# Patient Record
Sex: Female | Born: 1970 | Race: White | Hispanic: No | Marital: Single | Smoking: Former smoker
Health system: Southern US, Community
[De-identification: ages and names within clinical notes are randomized; demographics above are authoritative.]

## PROBLEM LIST (undated history)

## (undated) DIAGNOSIS — E079 Disorder of thyroid, unspecified: Secondary | ICD-10-CM

## (undated) HISTORY — PX: TONSILLECTOMY: SUR1361

## (undated) HISTORY — PX: COSMETIC SURGERY: SHX468

## (undated) HISTORY — PX: ABDOMINAL SURGERY: SHX537

## (undated) HISTORY — PX: CHOLECYSTECTOMY: SHX55

## (undated) HISTORY — PX: TUBAL LIGATION: SHX77

---

## 2016-07-21 ENCOUNTER — Encounter (HOSPITAL_BASED_OUTPATIENT_CLINIC_OR_DEPARTMENT_OTHER): Payer: Self-pay | Admitting: Emergency Medicine

## 2016-07-21 ENCOUNTER — Emergency Department (HOSPITAL_BASED_OUTPATIENT_CLINIC_OR_DEPARTMENT_OTHER)

## 2016-07-21 ENCOUNTER — Emergency Department (HOSPITAL_BASED_OUTPATIENT_CLINIC_OR_DEPARTMENT_OTHER)
Admission: EM | Admit: 2016-07-21 | Discharge: 2016-07-21 | Disposition: A | Discharge status: Other Acute Inpatient Hospital | Attending: Emergency Medicine | Admitting: Emergency Medicine

## 2016-07-21 DIAGNOSIS — I2699 Other pulmonary embolism without acute cor pulmonale: Secondary | ICD-10-CM

## 2016-07-21 DIAGNOSIS — Z87891 Personal history of nicotine dependence: Secondary | ICD-10-CM | POA: Insufficient documentation

## 2016-07-21 DIAGNOSIS — R079 Chest pain, unspecified: Secondary | ICD-10-CM | POA: Diagnosis present

## 2016-07-21 DIAGNOSIS — Z79899 Other long term (current) drug therapy: Secondary | ICD-10-CM | POA: Insufficient documentation

## 2016-07-21 DIAGNOSIS — R1012 Left upper quadrant pain: Secondary | ICD-10-CM | POA: Insufficient documentation

## 2016-07-21 DIAGNOSIS — Z9889 Other specified postprocedural states: Secondary | ICD-10-CM

## 2016-07-21 HISTORY — DX: Disorder of thyroid, unspecified: E07.9

## 2016-07-21 LAB — HEPARIN LEVEL (UNFRACTIONATED): Heparin Unfractionated: 0.31 IU/mL (ref 0.30–0.70)

## 2016-07-21 LAB — CBC WITH DIFFERENTIAL/PLATELET
Basophils Absolute: 0 10*3/uL (ref 0.0–0.1)
Basophils Relative: 0 %
EOS ABS: 0.3 10*3/uL (ref 0.0–0.7)
EOS PCT: 5 %
HCT: 35.5 % — ABNORMAL LOW (ref 36.0–46.0)
HEMOGLOBIN: 11.9 g/dL — AB (ref 12.0–15.0)
LYMPHS ABS: 1.9 10*3/uL (ref 0.7–4.0)
Lymphocytes Relative: 28 %
MCH: 31.8 pg (ref 26.0–34.0)
MCHC: 33.5 g/dL (ref 30.0–36.0)
MCV: 94.9 fL (ref 78.0–100.0)
MONO ABS: 0.9 10*3/uL (ref 0.1–1.0)
MONOS PCT: 13 %
Neutro Abs: 3.5 10*3/uL (ref 1.7–7.7)
Neutrophils Relative %: 54 %
PLATELETS: 308 10*3/uL (ref 150–400)
RBC: 3.74 MIL/uL — ABNORMAL LOW (ref 3.87–5.11)
RDW: 12.1 % (ref 11.5–15.5)
WBC: 6.6 10*3/uL (ref 4.0–10.5)

## 2016-07-21 LAB — URINALYSIS, ROUTINE W REFLEX MICROSCOPIC
BILIRUBIN URINE: NEGATIVE
GLUCOSE, UA: NEGATIVE mg/dL
HGB URINE DIPSTICK: NEGATIVE
Ketones, ur: NEGATIVE mg/dL
Leukocytes, UA: NEGATIVE
Nitrite: NEGATIVE
PROTEIN: NEGATIVE mg/dL
Specific Gravity, Urine: 1.005 (ref 1.005–1.030)
pH: 7.5 (ref 5.0–8.0)

## 2016-07-21 LAB — BASIC METABOLIC PANEL
ANION GAP: 7 (ref 5–15)
BUN: 8 mg/dL (ref 6–20)
CHLORIDE: 98 mmol/L — AB (ref 101–111)
CO2: 31 mmol/L (ref 22–32)
Calcium: 8.9 mg/dL (ref 8.9–10.3)
Creatinine, Ser: 0.8 mg/dL (ref 0.44–1.00)
GFR calc non Af Amer: >60 mL/min (ref >60)
GLUCOSE: 100 mg/dL — AB (ref 65–99)
POTASSIUM: 4.7 mmol/L (ref 3.5–5.1)
Sodium: 136 mmol/L (ref 135–145)

## 2016-07-21 LAB — LIPASE, BLOOD: Lipase: 22 U/L (ref 11–51)

## 2016-07-21 MED ORDER — HYDROMORPHONE HCL 1 MG/ML IJ SOLN
1.0000 mg | Freq: Once | INTRAMUSCULAR | Status: AC
  Administered 2016-07-21: 1 mg via INTRAVENOUS
  Filled 2016-07-21: qty 1

## 2016-07-21 MED ORDER — ONDANSETRON HCL 4 MG/2ML IJ SOLN
4.0000 mg | Freq: Once | INTRAMUSCULAR | Status: AC
  Administered 2016-07-21: 4 mg via INTRAVENOUS

## 2016-07-21 MED ORDER — IOPAMIDOL (ISOVUE-370) INJECTION 76%
100.0000 mL | Freq: Once | INTRAVENOUS | Status: AC | PRN
  Administered 2016-07-21: 100 mL via INTRAVENOUS

## 2016-07-21 MED ORDER — HEPARIN (PORCINE) IN NACL 100-0.45 UNIT/ML-% IJ SOLN
1200.0000 [IU]/h | INTRAMUSCULAR | Status: DC
  Administered 2016-07-21: 1200 [IU]/h via INTRAVENOUS
  Filled 2016-07-21: qty 250

## 2016-07-21 MED ORDER — ONDANSETRON HCL 4 MG/2ML IJ SOLN
INTRAMUSCULAR | Status: AC
  Administered 2016-07-21: 4 mg via INTRAVENOUS
  Filled 2016-07-21: qty 2

## 2016-07-21 MED ORDER — HEPARIN BOLUS VIA INFUSION
4500.0000 [IU] | Freq: Once | INTRAVENOUS | Status: AC
Start: 2016-07-21 — End: 2016-07-21
  Administered 2016-07-21: 4500 [IU] via INTRAVENOUS

## 2016-07-21 MED ORDER — OXYCODONE-ACETAMINOPHEN 5-325 MG PO TABS
1.0000 | ORAL_TABLET | Freq: Once | ORAL | Status: AC
  Administered 2016-07-21: 1 via ORAL
  Filled 2016-07-21: qty 1

## 2016-07-21 NOTE — ED Triage Notes (Signed)
Pt is post op day 6 breast augmentation.  Reports having difficulty taking deep breaths, also with rib cage pain. Denies chest pain. 2 drains present with no change in output. NAD at triage.

## 2016-07-21 NOTE — ED Provider Notes (Signed)
[facility]   CSN: [id number] Arrival date & time: 07/21/16  1217     History   Chief Complaint Chief Complaint  Patient presents with  . Post-op Problem  . Pleurisy    HPI Tracy Parker is a 46 y.o. female.  HPI   Patient presents with left lower chest/left upper abdominal pain that began suddenly, is sharp, constant, worse with deep inspiration, lying flat.  Has had constipation, disimpacted herself and then had a normal bowel movement.  Has associated chills, nausea with no vomiting.  Denies urinary symptoms.  Normal drainage from the abdominal drains, steadily decreasing.  Was previously doing 3,000 on the incentive spirometer and now only able to do 500.    Past Medical History:  Diagnosis Date  . Thyroid disease     There are no active problems to display for this patient.   Past Surgical History:  Procedure Laterality Date  . ABDOMINAL SURGERY    . CHOLECYSTECTOMY    . COSMETIC SURGERY    . TONSILLECTOMY    . TUBAL LIGATION      OB History    No data available       Home Medications    Prior to Admission medications   Medication Sig Start Date End Date Taking? Authorizing Provider  docusate sodium (COLACE) 100 MG capsule Take 100 mg by mouth 2 (two) times daily.   Yes [provider]  HYDROcodone-acetaminophen (NORCO/VICODIN) 5-325 MG tablet Take 1 tablet by mouth every 6 (six) hours as needed for moderate pain.   Yes [provider]  Multiple Vitamins-Minerals (MULTIVITAMIN WITH MINERALS) tablet Take 1 tablet by mouth daily.   Yes [provider]  oxyCODONE-acetaminophen (PERCOCET) 10-325 MG tablet Take 1 tablet by mouth every 4 (four) hours as needed for pain.   Yes [provider]  polyethylene glycol (MIRALAX / GLYCOLAX) packet Take 17 g by mouth daily.   Yes [provider]    Family History History reviewed. No pertinent family history.  Social History Social  History  Substance Use Topics  . Smoking status: Former Games developer  . Smokeless tobacco: Never Used  . Alcohol use Yes     Allergies   Patient has no known allergies.   Review of Systems Review of Systems  All other systems reviewed and are negative.    Physical Exam Updated Vital Signs BP 113/70   Pulse 80   Temp 98.2 F (36.8 C) (Oral)   Resp (!) 22   Ht 5\' 4"  (1.626 m)   Wt 150 lb (68 kg)   LMP 07/21/2016   SpO2 100%   BMI 25.75 kg/m   Physical Exam  Constitutional: She appears well-developed and well-nourished. No distress.  HENT:  Head: Normocephalic and atraumatic.  Neck: Neck supple.  Cardiovascular: Normal rate and regular rhythm.   Pulmonary/Chest: Effort normal. No respiratory distress. She has no wheezes. She has no rales.  Shallow respirations, clearly painful with deep inspiration.   Surgical incisions around left breast clean, dry, intact.    Abdominal: Soft. She exhibits no distension. There is tenderness in the left upper quadrant. There is no rebound and no guarding.  Lower abdominal and periumbilical surgical incisions clean, dry, intact.  Bilateral surgical drains from lower abdomen with serosanguinous fluid.    Neurological: She is alert.  Skin: She is not diaphoretic.  Nursing note and vitals reviewed.    ED Treatments / Results  Labs (all labs ordered are listed, but only  abnormal results are displayed) Labs Reviewed  BASIC METABOLIC PANEL - Abnormal; Notable for the following:       Result Value   Chloride 98 (*)    Glucose, Bld 100 (*)    All other components within normal limits  CBC WITH DIFFERENTIAL/PLATELET - Abnormal; Notable for the following:    RBC 3.74 (*)    Hemoglobin 11.9 (*)    HCT 35.5 (*)    All other components within normal limits  LIPASE, BLOOD  URINALYSIS, ROUTINE W REFLEX MICROSCOPIC  HEPARIN LEVEL (UNFRACTIONATED)  HEPARIN LEVEL (UNFRACTIONATED)  CBC    EKG  EKG Interpretation None        Radiology Dg Chest 2 View  Result Date: 07/21/2016 CLINICAL DATA:  Difficulty taking deep breaths, chest wall discomfort. Postoperative day 6 following breast augmentation. EXAM: CHEST  2 VIEW COMPARISON:  None in PACs FINDINGS: The lungs are reasonably well inflated. Increased density is noted at the left lung base. There is no pneumothorax or pleural effusion. The interstitial markings of both lungs are coarse. The heart and pulmonary vascularity are normal. The mediastinum is normal in width. There is faint calcification in the wall of the aortic arch. The bony thorax is unremarkable. IMPRESSION: Left basilar and possible right perihilar subsegmental atelectasis. There is no pneumonia, pleural effusion, or pneumothorax. Electronically Signed   By: David  [place] M.D.   On: 07/21/2016 13:00   Ct Angio Chest Pe W/cm &/or Wo Cm  Result Date: 07/21/2016 CLINICAL DATA:  Status post breast augmentation surgery with shortness of breath and chest pain. EXAM: CT ANGIOGRAPHY CHEST CT ABDOMEN AND PELVIS WITH CONTRAST TECHNIQUE: Multidetector CT imaging of the chest was performed using the standard protocol during bolus administration of intravenous contrast. Multiplanar CT image reconstructions and MIPs were obtained to evaluate the vascular anatomy. Multidetector CT imaging of the abdomen and pelvis was performed using the standard protocol during bolus administration of intravenous contrast. CONTRAST:  100 cc Isovue 370 COMPARISON:  None. FINDINGS: CTA CHEST FINDINGS Cardiovascular: The heart is normal in size. No pericardial effusion. The thoracic aorta are is normal. No aneurysm or dissection. The branch vessels are patent. No obvious coronary artery calcifications. The pulmonary arteries are suboptimally opacified but there are bilateral pulmonary emboli. No findings for right heart strain. Mediastinum/Nodes: No mediastinal or hilar mass or adenopathy. The esophagus is grossly normal. Lungs/Pleura: No  infiltrates, edema or large effusions. There is patchy subsegmental bibasilar atelectasis and a very small left pleural effusion. No worrisome pulmonary lesions. Chest wall/ Musculoskeletal: Surgical changes from recent bilateral breast augmentation procedure. There is air in the chest wall, right greater than left is also streaky areas of fluid and edema. No findings suspicious for abscess. Extensive right-sided chest wall collaterals are noted. This appears to be due to kinking/compression of the right subclavian vein between the clavicle and the first right rib. Review of the MIP images confirms the above findings. CT ABDOMEN and PELVIS FINDINGS Hepatobiliary: No focal hepatic lesions or intrahepatic biliary dilatation. Mild common bile duct dilatation due to prior cholecystectomy. Pancreas: No mass, inflammation or ductal dilatation. Spleen: Normal size.  No focal lesions. Adrenals/Urinary Tract: The adrenal glands and kidneys are unremarkable. Small left renal cysts are noted. The ureters and bladder are normal. Stomach/Bowel: The stomach, duodenum, small bowel and colon are grossly normal. No inflammatory changes, mass lesions or obstructive findings. The terminal ileum is normal. The appendix is normal. Fecalith or pill fragment noted in the cecum. Small appendicoliths are  noted. Vascular/Lymphatic: The aorta is normal in caliber. No dissection. Scattered aortic calcifications. The branch vessels are patent. The major venous structures are patent. No mesenteric or retroperitoneal mass or adenopathy. Small scattered lymph nodes are noted. Reproductive: Low-attenuation lesion in the fundal region of the uterus is most likely a degenerated fibroid. The ovaries are normal. Other: There are bilateral abdominal wall drainage catheters. No surrounding fluid collections. Patchy ill-defined edema and fluid in the subcutaneous fat likely related to the recent surgery. The abdominal wall muscles appear normal.  Musculoskeletal: No significant osseous findings. Review of the MIP images confirms the above findings. IMPRESSION: 1. Small bilateral pulmonary emboli. No findings for right heart strain. 2. Postoperative changes involving the chest wall and abdominal wall but no abscess or large hematoma. 3. Bibasilar atelectasis and small left pleural effusion. 4. No significant intra-abdominal/intrapelvic findings. 5. 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 6. Status post cholecystectomy with mild associated common bile duct dilatation. 7. Small appendicoliths. Electronically Signed   By: Rudie Meyer M.D.   On: 07/21/2016 14:59   Ct Abdomen Pelvis W Contrast  Result Date: 07/21/2016 CLINICAL DATA:  Status post breast augmentation surgery with shortness of breath and chest pain. EXAM: CT ANGIOGRAPHY CHEST CT ABDOMEN AND PELVIS WITH CONTRAST TECHNIQUE: Multidetector CT imaging of the chest was performed using the standard protocol during bolus administration of intravenous contrast. Multiplanar CT image reconstructions and MIPs were obtained to evaluate the vascular anatomy. Multidetector CT imaging of the abdomen and pelvis was performed using the standard protocol during bolus administration of intravenous contrast. CONTRAST:  100 cc Isovue 370 COMPARISON:  None. FINDINGS: CTA CHEST FINDINGS Cardiovascular: The heart is normal in size. No pericardial effusion. The thoracic aorta are is normal. No aneurysm or dissection. The branch vessels are patent. No obvious coronary artery calcifications. The pulmonary arteries are suboptimally opacified but there are bilateral pulmonary emboli. No findings for right heart strain. Mediastinum/Nodes: No mediastinal or hilar mass or adenopathy. The esophagus is grossly normal. Lungs/Pleura: No infiltrates, edema or large effusions. There is patchy subsegmental bibasilar atelectasis and a very small left pleural effusion. No worrisome pulmonary lesions. Chest  wall/ Musculoskeletal: Surgical changes from recent bilateral breast augmentation procedure. There is air in the chest wall, right greater than left is also streaky areas of fluid and edema. No findings suspicious for abscess. Extensive right-sided chest wall collaterals are noted. This appears to be due to kinking/compression of the right subclavian vein between the clavicle and the first right rib. Review of the MIP images confirms the above findings. CT ABDOMEN and PELVIS FINDINGS Hepatobiliary: No focal hepatic lesions or intrahepatic biliary dilatation. Mild common bile duct dilatation due to prior cholecystectomy. Pancreas: No mass, inflammation or ductal dilatation. Spleen: Normal size.  No focal lesions. Adrenals/Urinary Tract: The adrenal glands and kidneys are unremarkable. Small left renal cysts are noted. The ureters and bladder are normal. Stomach/Bowel: The stomach, duodenum, small bowel and colon are grossly normal. No inflammatory changes, mass lesions or obstructive findings. The terminal ileum is normal. The appendix is normal. Fecalith or pill fragment noted in the cecum. Small appendicoliths are noted. Vascular/Lymphatic: The aorta is normal in caliber. No dissection. Scattered aortic calcifications. The branch vessels are patent. The major venous structures are patent. No mesenteric or retroperitoneal mass or adenopathy. Small scattered lymph nodes are noted. Reproductive: Low-attenuation lesion in the fundal region of the uterus is most likely a degenerated fibroid. The ovaries are normal. Other: There are  bilateral abdominal wall drainage catheters. No surrounding fluid collections. Patchy ill-defined edema and fluid in the subcutaneous fat likely related to the recent surgery. The abdominal wall muscles appear normal. Musculoskeletal: No significant osseous findings. Review of the MIP images confirms the above findings. IMPRESSION: 1. Small bilateral pulmonary emboli. No findings for right  heart strain. 2. Postoperative changes involving the chest wall and abdominal wall but no abscess or large hematoma. 3. Bibasilar atelectasis and small left pleural effusion. 4. No significant intra-abdominal/intrapelvic findings. 5. 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 6. Status post cholecystectomy with mild associated common bile duct dilatation. 7. Small appendicoliths. Electronically Signed   By: Rudie MeyerP.  Gallerani M.D.   On: 07/21/2016 14:59    Procedures Procedures (including critical care time)  Medications Ordered in ED Medications  heparin ADULT infusion 100 units/mL (25000 units/2450mL sodium chloride 0.45%) (0 Units/hr Intravenous Paused 07/21/16 1616)  HYDROmorphone (DILAUDID) injection 1 mg (1 mg Intravenous Given 07/21/16 1403)  iopamidol (ISOVUE-370) 76 % injection 100 mL (100 mLs Intravenous Contrast Given 07/21/16 1419)  HYDROmorphone (DILAUDID) injection 1 mg (1 mg Intravenous Given 07/21/16 1451)  HYDROmorphone (DILAUDID) injection 1 mg (1 mg Intravenous Given 07/21/16 1528)  heparin bolus via infusion 4,500 Units (4,500 Units Intravenous Bolus from Bag 07/21/16 1536)     Initial Impression / Assessment and Plan / ED Course  I have reviewed the triage vital signs and the nursing notes.  Pertinent labs & imaging results that were available during my care of the patient were reviewed by me and considered in my medical decision making (see chart for details).  Clinical Course as of Jul 22 1722  Thu Jul 21, 2016  1552 I spoke with hospitalist at [facility] who confirms they do not have bed availability.    [EW]  1644 Pt changed her mind and [facility].  [place] cancelled [place] [facility] [place].  [facility] call.   [EW]  1651 [facility] hospital[facility] Dr Faylene MillionYeboah who accepts patient.  Pending bed.    [EW]    Clinical Course User Index [EW] Trixie DredgeWest, Cintya Daughety, [place]-C    Afebrile nontoxic  patient with recent surgery 6 days ago with acute left chest pain that is pleuritic.  Found to have bilateral PEs.  No hypoxia on room air.  [facility].  Pending bed assignment.  Dr Tegeler aware of patient.    Final Clinical Impressions(s) / ED Diagnoses   Final diagnoses:  Bilateral pulmonary embolism [facility](HCC)    New Prescriptions New Prescriptions   No medications on file     Trixie DredgeWest, Allex Madia, Cordelia Poche-C 07/21/16 1725    Doug SouJacubowitz, Sam, MD 07/23/16 1807

## 2016-07-21 NOTE — ED Notes (Signed)
Pt placed on O2 2lpm via nasal cannula

## 2016-07-21 NOTE — Progress Notes (Addendum)
ANTICOAGULATION CONSULT NOTE - Initial Consult  Pharmacy Consult for heparin Indication: pulmonary embolus  No Known Allergies  Patient Measurements: Height: 5\' 4"  (162.6 cm) Weight: 150 lb (68 kg) IBW/kg (Calculated) : 54.7 Heparin Dosing Weight: 68 kg  Vital Signs: Temp: 98.2 F (36.8 C) (05/17 1227) Temp Source: Oral (05/17 1227) BP: 119/62 (05/17 1443) Pulse Rate: 74 (05/17 1443)  Labs:  Recent Labs  07/21/16 1400  HGB 11.9*  HCT 35.5*  PLT 308  CREATININE 0.80    Estimated Creatinine Clearance: 81.5 mL/min (by C-G formula based on SCr of 0.8 mg/dL).   Medical History: Past Medical History:  Diagnosis Date  . Thyroid disease    Assessment: 46yo F presents with chest pain. Pharmacy consulted to dose heparin for PE. No PTA anticoagulation, Hgb 11.9, Plt wnl, no bleeding noted  Goal of Therapy:  Heparin level 0.3-0.7 units/ml Monitor platelets by anticoagulation protocol: Yes   Plan:  Give 4500 units bolus x 1 Start heparin infusion at 1200 units/hr Check anti-Xa level in 6 hours and daily while on heparin Continue to monitor H&H and platelets   Mackie Paienee Ackley, PharmD PGY1 Pharmacy Resident 07/21/2016 3:22 PM

## 2016-07-21 NOTE — ED Notes (Signed)
[facility] to facility.

## 2016-07-21 NOTE — ED Notes (Signed)
ED Provider at bedside. 

## 2016-07-21 NOTE — ED Provider Notes (Addendum)
Complains of of left-sided pleuritic chest pain which started suddenly yesterday afternoon pain is worse with deep inspiration. Patient is appears alert and mildly uncomfortable speaks in paragraphs. No respiratory distress. Chest x-ray viewed by me   Doug SouJacubowitz, Tecora Eustache, MD 07/21/16 1520    Doug SouJacubowitz, Hallie Ishida, MD 07/21/16 [phone]

## 2016-07-21 NOTE — ED Notes (Signed)
Pt requesting pain medication.  

## 2016-07-21 NOTE — ED Notes (Signed)
IV came out by way of patient movement. I have attempted x2. Another RN to try.

## 2016-07-21 NOTE — ED Notes (Signed)
Pt requesting po meds for pain control. Will speak with EDP.

## 2016-07-21 NOTE — ED Notes (Addendum)
Via [facility]link--spoke with Tracy Parker, [facility]

## 2018-12-29 IMAGING — CT CT ANGIO CHEST
3 of 13 series · 13 of 37 positions shown · IV contrast (isovue)
Comparison: None.

CLINICAL DATA: Status post breast augmentation surgery with
shortness of breath and chest pain.



[Series 4: axial st · axial · 0.93mm/px · z∈[-515,-345]mm · 2 of 102 slices shown]
[im 34/102  lung]
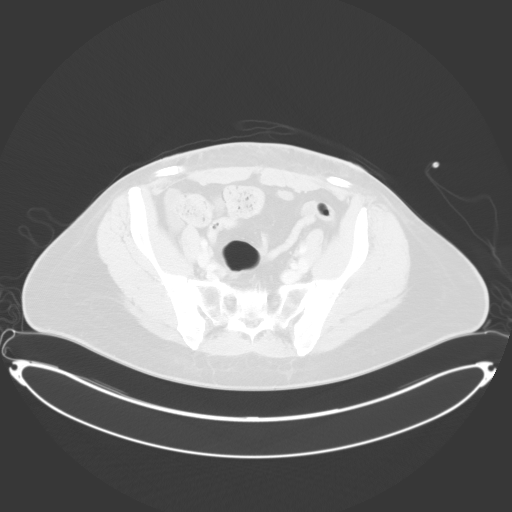
[im 68/102  mediastinal]
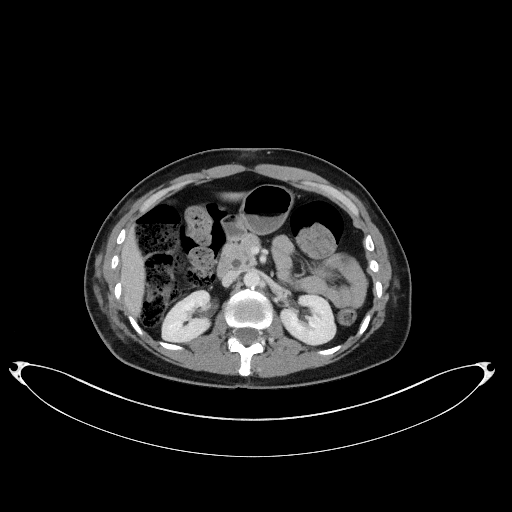

[Series 5: pe axial st · axial · 0.62mm/px · z∈[-248,-113]mm · 3 of 91 slices shown]
[im 23/91  lung]
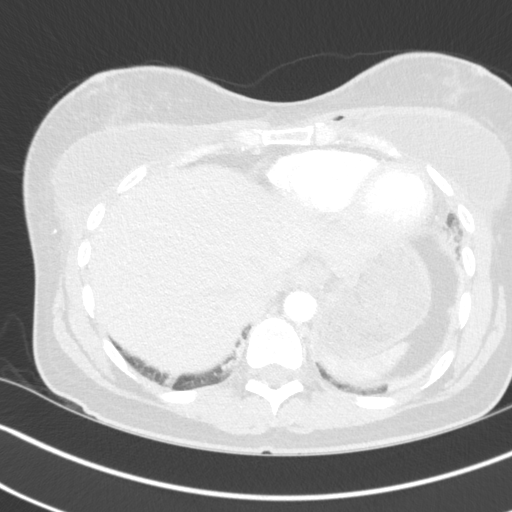
[im 46/91  lung]
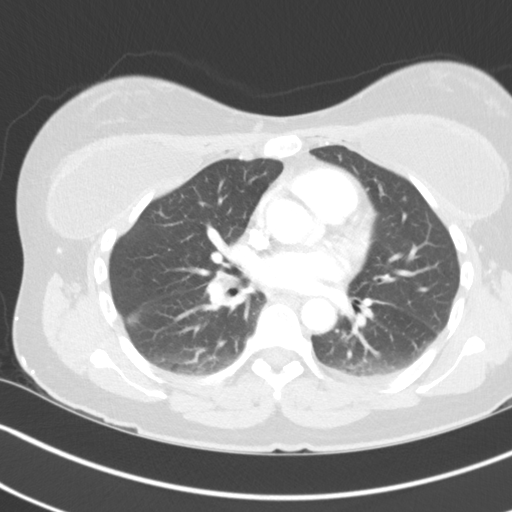
[im 68/91  lung]
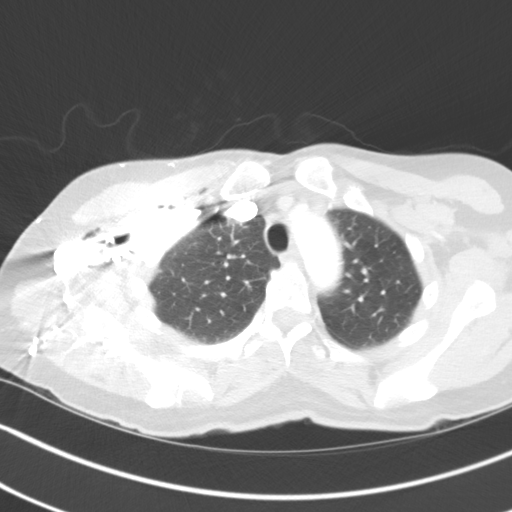

[Series 7: pe thins · axial · 0.62mm/px · z∈[-294,-65]mm · 8 of 271 slices shown]
[im 21/271  lung]
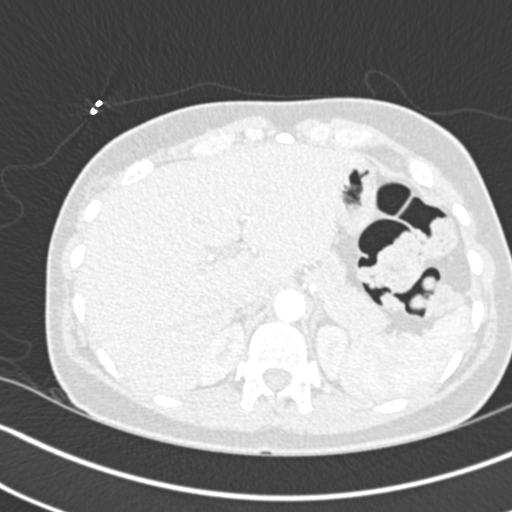
[im 63/271  lung]
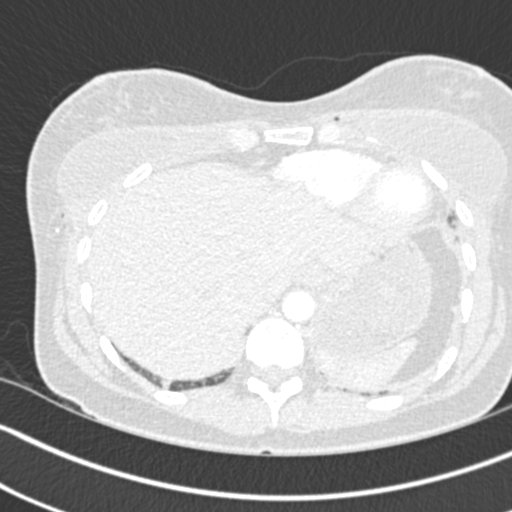
[im 84/271  lung]
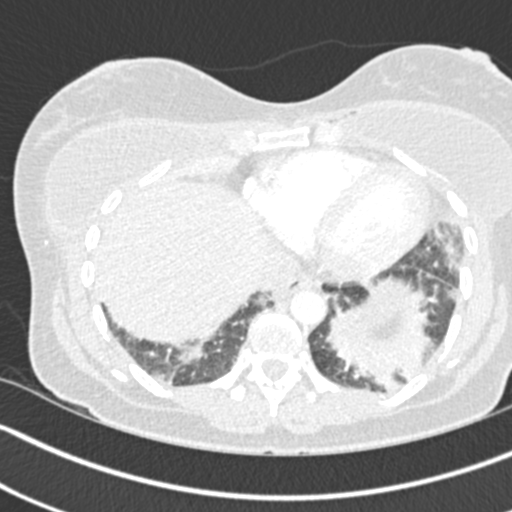
[im 125/271  lung]
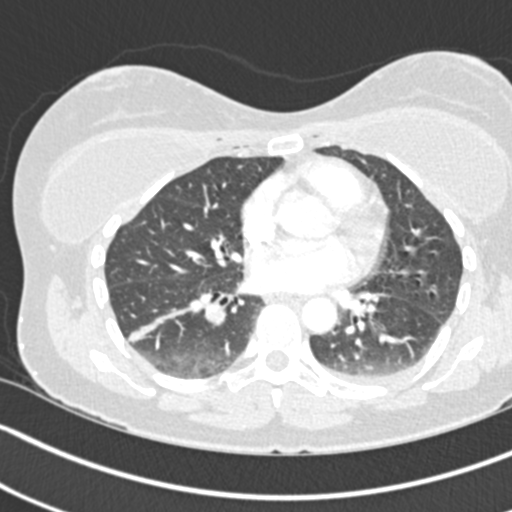
[im 146/271  lung]
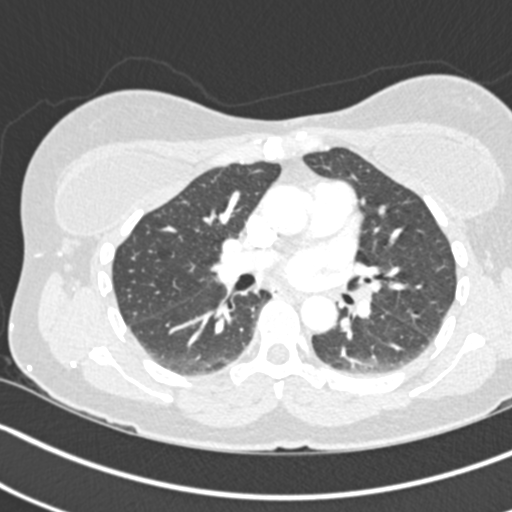
[im 187/271  lung]
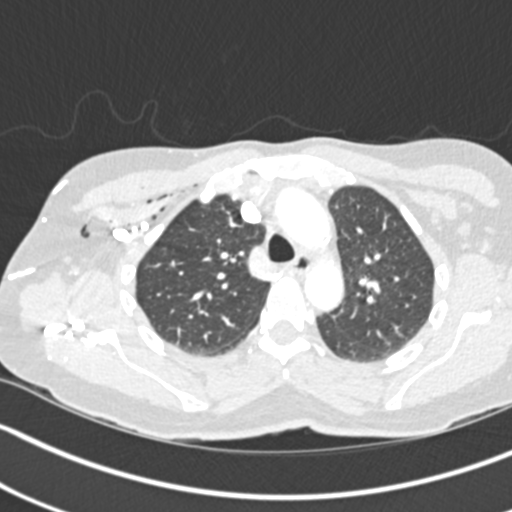
[im 208/271  lung]
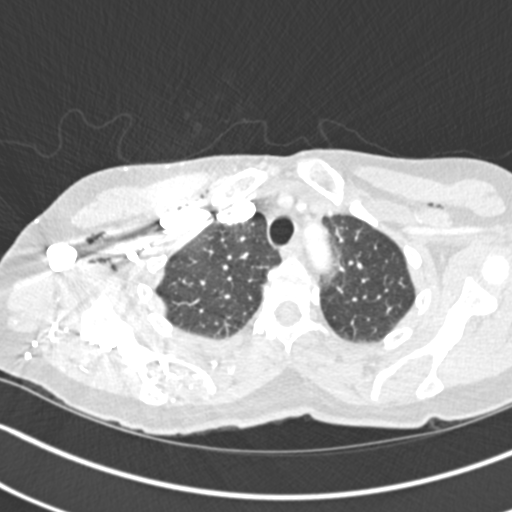
[im 250/271  lung]
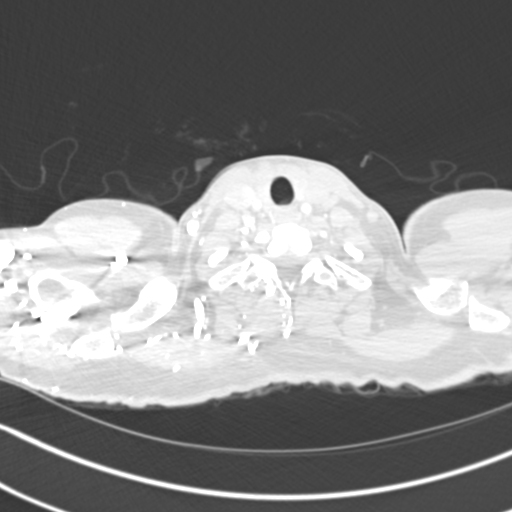

[13 of 37 positions shown; findings below may reference images not displayed]

FINDINGS: CTA CHEST FINDINGS

Cardiovascular: The heart is normal in size. No pericardial
effusion. The thoracic aorta are is normal. No aneurysm or
dissection. The branch vessels are patent. No obvious coronary
artery calcifications.

The pulmonary arteries are suboptimally opacified but there are
bilateral pulmonary emboli. No findings for right heart strain.

Mediastinum/Nodes: No mediastinal or hilar mass or adenopathy. The
esophagus is grossly normal.

Lungs/Pleura: No infiltrates, edema or large effusions. There is
patchy subsegmental bibasilar atelectasis and a very small left
pleural effusion. No worrisome pulmonary lesions.

Chest wall/ Musculoskeletal: Surgical changes from recent bilateral
breast augmentation procedure. There is air in the chest wall, right
greater than left is also streaky areas of fluid and edema. No
findings suspicious for abscess.

Extensive right-sided chest wall collaterals are noted. This appears
to be due to kinking/compression of the right subclavian vein
between the clavicle and the first right rib.

Review of the MIP images confirms the above findings.

CT ABDOMEN and PELVIS FINDINGS

Hepatobiliary: No focal hepatic lesions or intrahepatic biliary
dilatation. Mild common bile duct dilatation due to prior
cholecystectomy.

Pancreas: No mass, inflammation or ductal dilatation.

Spleen: Normal size.  No focal lesions.

Adrenals/Urinary Tract: The adrenal glands and kidneys are
unremarkable. Small left renal cysts are noted. The ureters and
bladder are normal.

Stomach/Bowel: The stomach, duodenum, small bowel and colon are
grossly normal. No inflammatory changes, mass lesions or obstructive
findings. The terminal ileum is normal. The appendix is normal.
Fecalith or pill fragment noted in the cecum. Small appendicoliths
are noted.

Vascular/Lymphatic: The aorta is normal in caliber. No dissection.
Scattered aortic calcifications. The branch vessels are patent. The
major venous structures are patent. No mesenteric or retroperitoneal
mass or adenopathy. Small scattered lymph nodes are noted.

Reproductive: Low-attenuation lesion in the fundal region of the
uterus is most likely a degenerated fibroid. The ovaries are normal.

Other: There are bilateral abdominal wall drainage catheters. No
surrounding fluid collections. Patchy ill-defined edema and fluid in
the subcutaneous fat likely related to the recent surgery. The
abdominal wall muscles appear normal.

Musculoskeletal: No significant osseous findings.

Review of the MIP images confirms the above findings.
IMPRESSION: 1. Small bilateral pulmonary emboli. No findings for right heart
strain.
2. Postoperative changes involving the chest wall and abdominal wall
but no abscess or large hematoma.
3. Bibasilar atelectasis and small left pleural effusion.
4. No significant intra-abdominal/intrapelvic findings.
5. Probable degenerated fibroid in the fundal region of the uterus.
6. Status post cholecystectomy with mild associated common bile duct
dilatation.
7. Small appendicoliths.
